# Patient Record
Sex: Female | Born: 1965 | Race: White | Hispanic: No | Marital: Married | Smoking: Never smoker
Health system: Southern US, Community
[De-identification: ages and names within clinical notes are randomized; demographics above are authoritative.]

## PROBLEM LIST (undated history)

## (undated) DIAGNOSIS — M1990 Unspecified osteoarthritis, unspecified site: Secondary | ICD-10-CM

## (undated) DIAGNOSIS — J45909 Unspecified asthma, uncomplicated: Secondary | ICD-10-CM

## (undated) DIAGNOSIS — T7840XA Allergy, unspecified, initial encounter: Secondary | ICD-10-CM

## (undated) HISTORY — DX: Unspecified asthma, uncomplicated: J45.909

## (undated) HISTORY — PX: BREAST SURGERY: SHX581

## (undated) HISTORY — DX: Allergy, unspecified, initial encounter: T78.40XA

## (undated) HISTORY — PX: TONSILECTOMY, ADENOIDECTOMY, BILATERAL MYRINGOTOMY AND TUBES: SHX2538

## (undated) HISTORY — DX: Unspecified osteoarthritis, unspecified site: M19.90

---

## 1997-01-15 HISTORY — PX: BREAST EXCISIONAL BIOPSY: SUR124

## 1997-08-30 ENCOUNTER — Other Ambulatory Visit: Admission: RE | Admit: 1997-08-30 | Discharge: 1997-08-30 | Payer: Self-pay | Admitting: Obstetrics and Gynecology

## 1997-11-10 ENCOUNTER — Other Ambulatory Visit: Admission: RE | Admit: 1997-11-10 | Discharge: 1997-11-10 | Payer: Self-pay | Admitting: *Deleted

## 1997-11-25 ENCOUNTER — Ambulatory Visit (HOSPITAL_COMMUNITY): Admission: RE | Admit: 1997-11-25 | Discharge: 1997-11-25 | Payer: Self-pay | Admitting: *Deleted

## 1998-09-14 ENCOUNTER — Other Ambulatory Visit: Admission: RE | Admit: 1998-09-14 | Discharge: 1998-09-14 | Payer: Self-pay | Admitting: Obstetrics and Gynecology

## 2014-01-18 ENCOUNTER — Ambulatory Visit (INDEPENDENT_AMBULATORY_CARE_PROVIDER_SITE_OTHER): Payer: BLUE CROSS/BLUE SHIELD | Admitting: Family Medicine

## 2014-01-18 VITALS — BP 108/60 | HR 83 | Temp 97.9°F | Resp 16 | Ht 64.0 in | Wt 126.0 lb

## 2014-01-18 DIAGNOSIS — S29019A Strain of muscle and tendon of unspecified wall of thorax, initial encounter: Secondary | ICD-10-CM

## 2014-01-18 DIAGNOSIS — L309 Dermatitis, unspecified: Secondary | ICD-10-CM

## 2014-01-18 DIAGNOSIS — S29009A Unspecified injury of muscle and tendon of unspecified wall of thorax, initial encounter: Secondary | ICD-10-CM

## 2014-01-18 MED ORDER — TRIAMCINOLONE ACETONIDE 0.1 % EX CREA: 1.0000 "application " | TOPICAL_CREAM | Freq: Two times a day (BID) | CUTANEOUS | Status: DC

## 2014-01-18 NOTE — Patient Instructions (Signed)
We will get you set up to see PT and also dermatology- show them the area on your skin that was concerning you Try the triamcinolone (steoid) cream twice a day as needed for your eczema.  Also avoid exposure to cold and prolonged exposure to water.   Use an OTC anti- fungal cream such as lamisil or lotromin for the area on your leg.

## 2014-01-18 NOTE — Progress Notes (Signed)
[facility] [address], [place] [address] [phone]  Date:  01/18/2014   Name:  Erika Mcmahon   DOB:  [birth year]   MRN:  [id number]  PCP:  No PCP Per Patient    Chief Complaint: Rash   History of Present Illness:  Erika Mcmahon is a 49 y.o. very pleasant female patient who presents with the following:  She has a history of eczema since childhood. She has an exacerbation recently mostly on her right arm. She has tried coconut oil and shea butter.  She also notes a circular lesion on her left calf which she thinks may be a ringworm.  She has not yet tried any topical steroids  Also, she notes some pain in her trunk that seemed to occur through weight lifting. She noted onset of pain about 2 months ago.  She would like to continue to train and would like to see PT.  She did not have any acute injury but noted onset of pain in her left ribs after she lifted some heavy weights.  If she lifts more lightly she is ok now "but I have sx if I lift toward 50% of my max.    She has tried some anti-inflammatories for her back.  Declines x-rays for the time being.    There are no active problems to display for this patient.   Past Medical History  Diagnosis Date  . Allergy   . Arthritis   . Asthma     Past Surgical History  Procedure Laterality Date  . Breast surgery    . Tonsilectomy, adenoidectomy, bilateral myringotomy and tubes      History  Substance Use Topics  . Smoking status: Never Smoker   . Smokeless tobacco: Not on file  . Alcohol Use: 0.0 oz/week    0 Not specified per week    History reviewed. No pertinent family history.  No Known Allergies  Medication list has been reviewed and updated.  No current outpatient prescriptions on file prior to visit.   No current facility-administered medications on file prior to visit.    Review of Systems:  As per HPI- otherwise negative. She is generall in good health  Physical  Examination: Filed Vitals:   01/18/14 1238  BP: 108/60  Pulse: 83  Temp: 97.9 F (36.6 C)  Resp: 16   Filed Vitals:   01/18/14 1238  Height:  (1.626 m)  Weight: 126 lb (57.153 kg)   Body mass index is 21.62 kg/(m^2). Ideal Body Weight: Weight in (lb) to have BMI = 25: 145.3  GEN: WDWN, NAD, Non-toxic, A & O x 3, looks well HEENT: Atraumatic, Normocephalic. Neck supple. No masses, No LAD. Ears and Nose: No external deformity. CV: RRR, No M/G/R. No JVD. No thrill. No extra heart sounds. PULM: CTA B, no wheezes, crackles, rhonchi. No retractions. No resp. distress. No accessory muscle use. ABD: S, NT, ND EXTR: No c/c/e NEURO Normal gait.  PSYCH: Normally interactive. Conversant. Not depressed or anxious appearing.  Calm demeanor.  Eczema on her hands and arms, more on the right Well- circumscribed ring worm on the medial right calf Mild tenderness over her left mid ribs, MSK in origin. No abdominal pain   Assessment and Plan: Eczema of both hands - Plan: triamcinolone cream (KENALOG) 0.1 %, Ambulatory referral to Dermatology, CANCELED: Ambulatory referral to Physical Therapy  Thoracic myofascial strain, initial encounter - Plan: Ambulatory referral to Physical Therapy, CANCELED: Ambulatory referral to Dermatology  Referral to derm and to PT as per her request.  Topical steroid cream for eczema, OTC antifungal for ringworm.   She agreed to come back if her trunk pain does not resolve soon  Signed Abbe Amsterdam, MD

## 2014-02-01 ENCOUNTER — Telehealth: Payer: Self-pay | Admitting: *Deleted

## 2014-02-01 NOTE — Telephone Encounter (Signed)
[facility] per Dr Patsy Lageropland.

## 2014-02-22 ENCOUNTER — Other Ambulatory Visit: Payer: Self-pay | Admitting: Obstetrics and Gynecology

## 2014-02-22 DIAGNOSIS — R928 Other abnormal and inconclusive findings on diagnostic imaging of breast: Secondary | ICD-10-CM

## 2014-03-10 ENCOUNTER — Other Ambulatory Visit: Payer: Self-pay

## 2014-03-17 ENCOUNTER — Other Ambulatory Visit: Payer: Self-pay

## 2014-03-24 ENCOUNTER — Ambulatory Visit
Admission: RE | Admit: 2014-03-24 | Discharge: 2014-03-24 | Disposition: A | Discharge status: Home / Self Care | Payer: BLUE CROSS/BLUE SHIELD | Source: Ambulatory Visit | Attending: Obstetrics and Gynecology | Admitting: Obstetrics and Gynecology

## 2014-03-24 ENCOUNTER — Other Ambulatory Visit: Payer: Self-pay | Admitting: Obstetrics and Gynecology

## 2014-03-24 DIAGNOSIS — R928 Other abnormal and inconclusive findings on diagnostic imaging of breast: Secondary | ICD-10-CM

## 2014-10-28 ENCOUNTER — Other Ambulatory Visit: Payer: Self-pay | Admitting: *Deleted

## 2014-10-28 ENCOUNTER — Encounter: Payer: Self-pay | Admitting: Sports Medicine

## 2014-10-28 ENCOUNTER — Ambulatory Visit (INDEPENDENT_AMBULATORY_CARE_PROVIDER_SITE_OTHER): Payer: BLUE CROSS/BLUE SHIELD | Admitting: Sports Medicine

## 2014-10-28 VITALS — BP 85/48 | HR 67 | Ht 64.0 in | Wt 126.0 lb

## 2014-10-28 DIAGNOSIS — M25421 Effusion, right elbow: Secondary | ICD-10-CM

## 2014-10-28 DIAGNOSIS — IMO0002 Reserved for concepts with insufficient information to code with codable children: Secondary | ICD-10-CM

## 2014-10-28 DIAGNOSIS — M25521 Pain in right elbow: Secondary | ICD-10-CM

## 2014-10-28 DIAGNOSIS — M779 Enthesopathy, unspecified: Secondary | ICD-10-CM | POA: Diagnosis not present

## 2014-10-28 MED ORDER — NITROGLYCERIN 0.2 MG/HR TD PT24: MEDICATED_PATCH | TRANSDERMAL | Status: DC

## 2014-10-28 NOTE — Assessment & Plan Note (Signed)
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  She does have evidence of disruption of the RCL on the lateral aspect of the deep fibers of the common extensor tendon.  Small calcific deposition in this region as well.  US does not show cartilage deficits of the capitellum or trochlea - Will attempt conservative measures with nitro patch, eccentric exercises, compression sleeve - With the RCL and small OCD lesion however, surgical management long term is an option if she does not respond over 3-6 months.

## 2014-10-28 NOTE — Progress Notes (Signed)
  Erika Mcmahon Slovacek - 49 y.o. female MRN [id number]  Date of birth: October 11, 1965 Erika Mcmahon Newman is a 49 y.o. female who presents today for R elbow pain   Right elbow pain, initial visit-patient presents today with ongoing right elbow pain for several months now. 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 However she also performs a lot of weight lifting activity including Olympic lifts and CrossFit. She denies a specific injury which brought this on and has noticed pain in the medial, lateral, posterior aspect of her elbow. There is no paresthesias or weakness going down her arm. No pain in her forearm at this time. Rest does improve this. Pain is worse with any type pushup activity or febrile elbow flexion with Olympic lifting. She denies any dislocation or popping/locking of the joint. Denies frank swelling.  She has tried massage therapy which has helped along with dry needling. She also has tried some topical medication which is helped with the pain as well.  PMHx - Updated and reviewed.  Contributory factors include: Noncontributory  PSHx - Updated and reviewed.  Contributory factors include:  Noncontributory  FHx - Updated and reviewed.  Contributory factors include:  Non contributory  Medications - None    ROS Per HPI.  12 point negative other than per HPI.   Exam:  Filed Vitals:   10/28/14 0836  BP: 85/48  Pulse: 67   Gen: NAD, AAO x 3.  Normal affect and mood.  Cardiorespiratory - Normal respiratory effort/rate.  RRR Skin: No rash, dry intact Extremities: No edema, vascularly intact 2+  R elbow: Slightly increased carrying angle R > L.  Otherwise normal inspection.  No erythema or skin changes.  Minimal TTP medial/lateral epicondyle.  Full ROM.  MS 5/5 in all directions. Negative third finger extension test.  Negative eccentric loading of flexion/extension.  Negative valgus stress testing. Neurovascular intact  Imaging:  Ultrasound of right elbow reveals normal appearing hyaline  cartilage of the capitellum and radial head along with the trochlea and coronoid fossa. She does have evidence of hypoechoic ill-defined heterogenic areas of her medial and lateral common tendons. She has a 30% hypoechoic tear of the deep portion of her extensor common tendon/radial collateral ligament with a small calcific piece in the radiocapitellar joint.

## 2014-11-11 ENCOUNTER — Other Ambulatory Visit: Payer: Self-pay | Admitting: Obstetrics and Gynecology

## 2014-11-11 DIAGNOSIS — N6489 Other specified disorders of breast: Secondary | ICD-10-CM

## 2014-11-19 ENCOUNTER — Ambulatory Visit
Admission: RE | Admit: 2014-11-19 | Discharge: 2014-11-19 | Disposition: A | Discharge status: Home / Self Care | Payer: BLUE CROSS/BLUE SHIELD | Source: Ambulatory Visit | Attending: Obstetrics and Gynecology | Admitting: Obstetrics and Gynecology

## 2014-11-19 DIAGNOSIS — N6489 Other specified disorders of breast: Secondary | ICD-10-CM

## 2014-12-08 ENCOUNTER — Ambulatory Visit (INDEPENDENT_AMBULATORY_CARE_PROVIDER_SITE_OTHER): Payer: BLUE CROSS/BLUE SHIELD | Admitting: Sports Medicine

## 2014-12-08 ENCOUNTER — Encounter: Payer: Self-pay | Admitting: Sports Medicine

## 2014-12-08 VITALS — BP 98/60 | Ht 64.0 in | Wt 126.0 lb

## 2014-12-08 DIAGNOSIS — IMO0002 Reserved for concepts with insufficient information to code with codable children: Secondary | ICD-10-CM

## 2014-12-08 DIAGNOSIS — M779 Enthesopathy, unspecified: Secondary | ICD-10-CM | POA: Diagnosis not present

## 2014-12-08 NOTE — Patient Instructions (Addendum)
Beet root juice = used for oral nitric oxide supplement  Tart cherry juice for antioxidant activity - particularly good after hard workouts 2 to 4 hours after  Remember key movements for elbow Pronation and supination Wrist flexion and extension Hand grip  Maintain these activities long term  I suspect in 6 weeks you won't need us that would be a normal healing time for this injury  NTG still helps but don't push the skin irritation  Compression primarily valuable for hard lifts    

## 2014-12-08 NOTE — Assessment & Plan Note (Signed)
plan under office visit as she has made significant improvement

## 2014-12-08 NOTE — Progress Notes (Signed)
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 Last visit she appeared to have some damage to both the medial and the lateral epicondyle on the right  Since that time she has used nitroglycerin She has used an elbow compression sleeve She is doing home exercises She has continued working with the therapist and doing some dry needling  She feels that she is about 80% better  Review of systems No other joint swelling No numbness or tingling in the hand No neck pain or symptoms  Physical exam Muscular female in no acute distress BP 98/60 mmHg  Ht 5\' 4"  (1.626 m)  Wt 126 lb (57.153 kg)  BMI 21.62 kg/m2  Full range of motion of the right elbow No tenderness to palpation of lateral epicondyle No tenderness to palpation medial epicondyle Excellent strength on flexion and extension Book test on the right does cause mild pain with extension Book test on right causes no pain with flexion Handgrip is strong and is not painful Pain with pronation and supinatin is mild  Assessment; adequate healing of medial and lateral epicondylitis related to weight lifting   Plan Try some supplements for muscle recovery  Beet root juice = used for oral nitric oxide supplement  Tart cherry juice for antioxidant activity - particularly good after hard workouts 2 to 4 hours after  Remember key movements for elbow Pronation and supination Wrist flexion and extension Hand grip  Maintain these activities long term  I suspect in 6 weeks you won't need us that would be a normal healing time for this injury  NTG still helps but don't push the skin irritation  Compression primarily valuable for hard lifts

## 2014-12-08 NOTE — Progress Notes (Signed)
Beet root juice = used for oral nitric oxide supplement  Tart cherry juice for antioxidant activity - particularly good after hard workouts 2 to 4 hours after  Remember key movements for elbow Pronation and supination Wrist flexion and extension Hand grip  Maintain these activities long term  I suspect in 6 weeks you won't need us that would be a normal healing time for this injury  NTG still helps but don't push the skin irritation  Compression primarily valuable for hard lifts

## 2015-01-11 ENCOUNTER — Ambulatory Visit (INDEPENDENT_AMBULATORY_CARE_PROVIDER_SITE_OTHER): Payer: BLUE CROSS/BLUE SHIELD | Admitting: Family Medicine

## 2015-01-11 VITALS — BP 110/60 | HR 75 | Temp 98.0°F | Resp 20 | Ht 64.0 in | Wt 130.2 lb

## 2015-01-11 DIAGNOSIS — J209 Acute bronchitis, unspecified: Secondary | ICD-10-CM

## 2015-01-11 MED ORDER — ALBUTEROL SULFATE 108 (90 BASE) MCG/ACT IN AEPB: 2.0000 | INHALATION_SPRAY | RESPIRATORY_TRACT | Status: DC | PRN

## 2015-01-11 MED ORDER — HYDROCOD POLST-CPM POLST ER 10-8 MG/5ML PO SUER: 5.0000 mL | Freq: Every evening | ORAL | Status: DC | PRN

## 2015-01-11 MED ORDER — MUCINEX DM MAXIMUM STRENGTH 60-1200 MG PO TB12: 1.0000 | ORAL_TABLET | Freq: Two times a day (BID) | ORAL | Status: DC

## 2015-01-11 NOTE — Progress Notes (Signed)
Subjective:  By signing my name below, I, Erika Mcmahon, attest that this documentation has been prepared under the direction and in the presence of Erika SorensonEva Sonji Starkes, MD.  Erika Mcmahon, Medical Scribe. 01/11/2015.  4:35 PM.   Patient ID: Erika Mcmahon, female    DOB: April 16, 1965, 49 y.o.   MRN: [id number]  Chief Complaint  Patient presents with  . Cough    x 2 week   . Headache    HPI HPI Comments: Erika Mcmahon is a 49 y.o. female who presents to [facility] complaining of productive colorful cough, gradual onset 2 weeks ago.  She reports that the symptoms started with post nasal drip. She also notes associated symptoms of wheezing, sore throat, shortness of breath, chest tightness, and headache. She states that she took zytrec that relieved her sore throat, vitamine C, and natural home medication. She reports sick contact with her wife at home who present with similar sx. She has a hx bronchitis, and asthma, however she does not currently use an inhaler. She reports finding good relief with inhalers previously. Pt is not a smoker. She denies chills, fever, or chest pain.    Patient Active Problem List   Diagnosis Date Noted  . Epicondylitis 10/28/2014   Past Medical History  Diagnosis Date  . Allergy   . Arthritis   . Asthma    Past Surgical History  Procedure Laterality Date  . Breast surgery    . Tonsilectomy, adenoidectomy, bilateral myringotomy and tubes     No Known Allergies Prior to Admission medications   Medication Sig Start Date End Date Taking? Authorizing Provider  nitroGLYCERIN (NITRODUR - DOSED IN MG/24 HR) 0.2 mg/hr patch Place 1/4 patch to affected area daily 10/28/14  Yes Enid BaasKarl Fields, MD  triamcinolone cream (KENALOG) 0.1 % Apply 1 application topically 2 (two) times daily. Use as needed for eczema 01/18/14  Yes Pearline CablesJessica C Copland, MD   Social History   Social History  . Marital Status: Married    Spouse Name: N/A  . Number of Children: N/A    . Years of Education: N/A   Occupational History  . Not on file.   Social History Main Topics  . Smoking status: Never Smoker   . Smokeless tobacco: Not on file  . Alcohol Use: 0.0 oz/week    0 Standard drinks or equivalent per week  . Drug Use: No  . Sexual Activity: Not on file   Other Topics Concern  . Not on file   Social History Narrative    Review of Systems  Constitutional: Negative for fever and chills.  HENT: Positive for postnasal drip and sore throat.   Respiratory: Positive for cough, chest tightness, shortness of breath and wheezing.   Cardiovascular: Negative for chest pain.  Neurological: Positive for headaches.       Objective:   Physical Exam  Constitutional: She is oriented to person, place, and time. She appears well-developed and well-nourished. No distress.  HENT:  Head: Normocephalic and atraumatic.  Right Ear: Tympanic membrane and external ear normal.  Left Ear: Tympanic membrane and external ear normal.  Mouth/Throat: Oropharynx is clear and moist. No oropharyngeal exudate.  Lots of post nasal drip.  Normal thyroid.  A little anterior and posterior adenopathy palpable.   Eyes: EOM are normal. Pupils are equal, round, and reactive to light.  Neck: Neck supple.  Cardiovascular: Normal rate, regular rhythm and normal heart sounds.   No murmur heard. Pulmonary/Chest: Effort  normal and breath sounds normal. No respiratory distress. She has no wheezes.  Good air movement.   Neurological: She is alert and oriented to person, place, and time. No cranial nerve deficit.  Skin: Skin is warm and dry.  Psychiatric: She has a normal mood and affect. Her behavior is normal.  Nursing note and vitals reviewed.   BP 110/60 mmHg  Pulse 75  Temp(Src) 98 F (36.7 C) (Oral)  Resp 20  Ht  (1.626 m)  Wt 130 lb 3.2 oz (59.058 kg)  BMI 22.34 kg/m2  SpO2 98%  LMP 06/15/2013     Assessment & Plan:  If symptoms worsen, I will call in Z-pac. 1. Acute  bronchitis, unspecified organism     Meds ordered this encounter  Medications  . Albuterol Sulfate (PROAIR RESPICLICK) 108 (90 Base) MCG/ACT AEPB    Sig: Inhale 2 puffs into the lungs every 4 (four) hours as needed.    Dispense:  1 each    Refill:  2  . chlorpheniramine-HYDROcodone (TUSSIONEX PENNKINETIC ER) 10-8 MG/5ML SUER    Sig: Take 5 mLs by mouth at bedtime as needed for cough.    Dispense:  180 mL    Refill:  0  . Dextromethorphan-Guaifenesin (MUCINEX DM MAXIMUM STRENGTH) 60-1200 MG TB12    Sig: Take 1 tablet by mouth every 12 (twelve) hours.    Dispense:  30 each    Refill:  1    I personally performed the services described in this documentation, which was scribed in my presence. The recorded information has been reviewed and considered, and addended by me as needed.  Erika Sorenson, MD MPH

## 2015-01-11 NOTE — Patient Instructions (Signed)

## 2015-04-04 ENCOUNTER — Other Ambulatory Visit: Payer: Self-pay | Admitting: Obstetrics and Gynecology

## 2015-04-04 DIAGNOSIS — N6489 Other specified disorders of breast: Secondary | ICD-10-CM

## 2015-04-12 ENCOUNTER — Ambulatory Visit
Admission: RE | Admit: 2015-04-12 | Discharge: 2015-04-12 | Disposition: A | Discharge status: Home / Self Care | Payer: BLUE CROSS/BLUE SHIELD | Source: Ambulatory Visit | Attending: Obstetrics and Gynecology | Admitting: Obstetrics and Gynecology

## 2015-04-12 DIAGNOSIS — N6489 Other specified disorders of breast: Secondary | ICD-10-CM

## 2015-04-14 ENCOUNTER — Other Ambulatory Visit: Payer: Self-pay | Admitting: Sports Medicine

## 2015-08-24 ENCOUNTER — Other Ambulatory Visit: Payer: Self-pay | Admitting: Gastroenterology

## 2015-08-24 DIAGNOSIS — R932 Abnormal findings on diagnostic imaging of liver and biliary tract: Secondary | ICD-10-CM

## 2015-09-08 ENCOUNTER — Ambulatory Visit
Admission: RE | Admit: 2015-09-08 | Discharge: 2015-09-08 | Disposition: A | Discharge status: Home / Self Care | Payer: BLUE CROSS/BLUE SHIELD | Source: Ambulatory Visit | Attending: Gastroenterology | Admitting: Gastroenterology

## 2015-09-08 DIAGNOSIS — R932 Abnormal findings on diagnostic imaging of liver and biliary tract: Secondary | ICD-10-CM

## 2015-09-16 ENCOUNTER — Inpatient Hospital Stay
Admission: RE | Admit: 2015-09-16 | Discharge: 2015-09-16 | Disposition: A | Discharge status: Home / Self Care | Payer: Self-pay | Source: Ambulatory Visit | Attending: Gastroenterology | Admitting: Gastroenterology

## 2015-09-16 ENCOUNTER — Other Ambulatory Visit: Payer: Self-pay | Admitting: Gastroenterology

## 2015-09-16 DIAGNOSIS — R1011 Right upper quadrant pain: Secondary | ICD-10-CM

## 2016-01-28 ENCOUNTER — Encounter (HOSPITAL_COMMUNITY): Payer: Self-pay | Admitting: Emergency Medicine

## 2016-01-28 ENCOUNTER — Ambulatory Visit (INDEPENDENT_AMBULATORY_CARE_PROVIDER_SITE_OTHER): Payer: BLUE CROSS/BLUE SHIELD

## 2016-01-28 ENCOUNTER — Ambulatory Visit (HOSPITAL_COMMUNITY)
Admission: EM | Admit: 2016-01-28 | Discharge: 2016-01-28 | Disposition: A | Discharge status: Home / Self Care | Payer: BLUE CROSS/BLUE SHIELD | Attending: Internal Medicine | Admitting: Internal Medicine

## 2016-01-28 DIAGNOSIS — S52125A Nondisplaced fracture of head of left radius, initial encounter for closed fracture: Secondary | ICD-10-CM

## 2016-01-28 NOTE — ED Triage Notes (Signed)
[facility] a complaint of left elbow pain secondary to fall that occurred today.

## 2016-01-28 NOTE — ED Provider Notes (Signed)
CSN: [id number]     Arrival date & time 01/28/16  1753 History   First MD Initiated Contact with Patient 01/28/16 2007     Chief Complaint  Patient presents with  . Fall   (Consider location/radiation/quality/duration/timing/severity/associated sxs/prior Treatment) Patient is a well-appearing 51 year old female, was doing slacklining when she fell off the slack line, landed on her left side body, now is having pain at the left elbow and wrisk. Pain is 0 out of 10 and at rest. Pain is worst with movement. Denies numbness or tingling sensation.       Past Medical History:  Diagnosis Date  . Allergy   . Arthritis   . Asthma    Past Surgical History:  Procedure Laterality Date  . BREAST SURGERY    . TONSILECTOMY, ADENOIDECTOMY, BILATERAL MYRINGOTOMY AND TUBES     History reviewed. No pertinent family history. Social History  Substance Use Topics  . Smoking status: Never Smoker  . Smokeless tobacco: Not on file  . Alcohol use 0.0 oz/week   OB History    No data available     Review of Systems  All other systems reviewed and are negative.   Allergies  Patient has no known allergies.  Home Medications   Prior to Admission medications   Not on File   Meds Ordered and Administered this Visit  Medications - No data to display  BP 107/56 (BP Location: Right Arm)   Pulse 68   Temp 98 F (36.7 C) (Oral)   Resp 16   LMP 06/15/2013   SpO2 100%  No data found.   Physical Exam  Constitutional: She appears well-developed and well-nourished.  Cardiovascular: Normal rate, regular rhythm and normal heart sounds.   Pulmonary/Chest: Effort normal and breath sounds normal. No respiratory distress. She has no wheezes.  Musculoskeletal:  Left wrist: No swelling, no deformity, has full range of motion.   Left Elbow: No swelling, no deformity, pain reproducible with movement. Has slight limited ROM   Nursing note and vitals reviewed.   Urgent Care Course   Clinical  Course     Procedures (including critical care time)  Labs Review Labs Reviewed - No data to display  Imaging Review Dg Elbow Complete Left  Result Date: 01/28/2016 CLINICAL DATA:  Patient states that she fell today injuring her left wrist and elbow, patient used the left side to catch her fall. Hx of break to the left arm as a child. EXAM: LEFT ELBOW - COMPLETE 3+ VIEW COMPARISON:  None. FINDINGS: There is a subtle fracture of the radial neck, nondisplaced and non comminuted. No other evidence of a fracture. The elbow joint is normally spaced and aligned. Probable small joint effusion.  Soft tissues are unremarkable. IMPRESSION: Subtle nondisplaced fracture of the radial neck Electronically Signed   By: Amie Portlandavid  Ormond M.D.   On: 01/28/2016 20:01   Dg Wrist Complete Left  Result Date: 01/28/2016 CLINICAL DATA:  Patient states that she fell today injuring her left wrist and elbow, patient used the left side to catch her fall. Hx of break to the left arm as a child. EXAM: LEFT WRIST - COMPLETE 3+ VIEW COMPARISON:  None. FINDINGS: There is no evidence of fracture or dislocation. There is no evidence of arthropathy or other focal bone abnormality. Soft tissues are unremarkable. IMPRESSION: Negative. Electronically Signed   By: Amie Portlandavid  Ormond M.D.   On: 01/28/2016 19:59   MDM   1. Closed nondisplaced fracture of head of left radius, initial  encounter    Wrist xray unremarkable. Elbow xray shows a subtle nondisplaced fracture of the radial neck. Sugar tong splint applied along with a sling. Referred to [facility] orthopaedic for further management. Informed to call Monday to schedule an appointment as soon as possible. Splint care discussed. Take ibuprofen for pain relief.   Lucia Estelle, NP 01/28/16 2053

## 2016-01-28 NOTE — Progress Notes (Signed)
Orthopedic Tech Progress Note Patient Details:  Erika RedbirdLisa Cerino 12-31-1965 [id number]  Ortho Devices Type of Ortho Device: Sugartong splint, Arm sling Ortho Device/Splint Location: lue Ortho Device/Splint Interventions: Ordered, Application After speaking with dr decided pt only needed a regular sugartong.  Trinna PostMartinez, [name] [name] 01/28/2016, 8:57 PM

## 2016-01-30 ENCOUNTER — Encounter: Payer: Self-pay | Admitting: Family Medicine

## 2016-01-30 ENCOUNTER — Ambulatory Visit (INDEPENDENT_AMBULATORY_CARE_PROVIDER_SITE_OTHER): Payer: BLUE CROSS/BLUE SHIELD | Admitting: Family Medicine

## 2016-01-30 DIAGNOSIS — S59902A Unspecified injury of left elbow, initial encounter: Secondary | ICD-10-CM

## 2016-01-30 NOTE — Patient Instructions (Addendum)
You have a radial neck fracture. Wear the splint at all times. Follow up with me in just over a week. We will remove the splint, repeat x-rays, plan to start motion exercises and advance to strengthening. These typically take 4-6 weeks to resolve though regaining full motion may take longer. Ibuprofen 600mg  3-4 times a day with food OR aleve 2 tabs twice a day with food for pain and inflammation. To return to rock climbing, you have to have full motion, strength 80% of the opposite side, no pain, and at least 4 weeks (for fracture healing). Elevate above your heart as much as possible.

## 2016-01-31 DIAGNOSIS — S59902D Unspecified injury of left elbow, subsequent encounter: Secondary | ICD-10-CM | POA: Insufficient documentation

## 2016-01-31 NOTE — Progress Notes (Signed)
PCP: Dr. Zelphia CairoGretchen Adkins  Subjective:   HPI: Patient is a 51 y.o. female here for left elbow injury.  Patient reports on 1/13 she was slacklining when she fell off and landed onto her left side. Fell mainly onto left wrist but felt pain, pop left elbow. She has been taking ibuprofen 800mg  daily. Some numbness into thumb. Pain level 1/10, dull posterior elbow. Wearing a sugar tong splint with sling from ED. No skin changes though some swelling.  Past Medical History:  Diagnosis Date  . Allergy   . Arthritis   . Asthma     No current outpatient prescriptions on file prior to visit.   No current facility-administered medications on file prior to visit.     Past Surgical History:  Procedure Laterality Date  . BREAST SURGERY    . TONSILECTOMY, ADENOIDECTOMY, BILATERAL MYRINGOTOMY AND TUBES      No Known Allergies  Social History   Social History  . Marital status: Married    Spouse name: N/A  . Number of children: N/A  . Years of education: N/A   Occupational History  . Not on file.   Social History Main Topics  . Smoking status: Never Smoker  . Smokeless tobacco: Never Used  . Alcohol use 0.0 oz/week  . Drug use: No  . Sexual activity: Not on file   Other Topics Concern  . Not on file   Social History Narrative  . No narrative on file    No family history on file.  BP (!) 97/58   Pulse 72   Ht 5\' 4"  (1.626 m)   Wt 125 lb (56.7 kg)   LMP 06/15/2013   BMI 21.46 kg/m   Review of Systems: See HPI above.     Objective:  Physical Exam:  Gen: NAD, comfortable in exam room  Left elbow: Mild swelling circumferentially.  No bruising, other deformity. Minimal TTP radial head.  No other tenderness about elbow or upper arm. Lacks about 15 degrees extension.  Full flexion.   Collateral ligaments intact. NVI distally.  Right elbow: FROM without pain.   Assessment & Plan:  1. Left elbow injury - independently reviewed radiographs - effusion noted  and radial neck fracture that is nondisplaced.  Sugar tong splint felt uncomfortable - switched to posterior splint today with sling.  Elevation.  Ibuprofen or aleve if needed.  F/u in just over a week - hope to remove splint and sling, repeat x-rays, start motion exercises.

## 2016-01-31 NOTE — Assessment & Plan Note (Signed)
independently reviewed radiographs - effusion noted and radial neck fracture that is nondisplaced.  Sugar tong splint felt uncomfortable - switched to posterior splint today with sling.  Elevation.  Ibuprofen or aleve if needed.  F/u in just over a week - hope to remove splint and sling, repeat x-rays, start motion exercises.

## 2016-02-08 ENCOUNTER — Ambulatory Visit (HOSPITAL_BASED_OUTPATIENT_CLINIC_OR_DEPARTMENT_OTHER)
Admission: RE | Admit: 2016-02-08 | Discharge: 2016-02-08 | Disposition: A | Discharge status: Home / Self Care | Payer: BLUE CROSS/BLUE SHIELD | Source: Ambulatory Visit | Attending: Family Medicine | Admitting: Family Medicine

## 2016-02-08 ENCOUNTER — Ambulatory Visit (INDEPENDENT_AMBULATORY_CARE_PROVIDER_SITE_OTHER): Payer: BLUE CROSS/BLUE SHIELD | Admitting: Family Medicine

## 2016-02-08 ENCOUNTER — Encounter: Payer: Self-pay | Admitting: Family Medicine

## 2016-02-08 VITALS — BP 110/71 | HR 90 | Ht 64.0 in | Wt 125.0 lb

## 2016-02-08 DIAGNOSIS — S59902D Unspecified injury of left elbow, subsequent encounter: Secondary | ICD-10-CM | POA: Diagnosis not present

## 2016-02-08 DIAGNOSIS — S59902A Unspecified injury of left elbow, initial encounter: Secondary | ICD-10-CM | POA: Diagnosis not present

## 2016-02-08 DIAGNOSIS — S52132A Displaced fracture of neck of left radius, initial encounter for closed fracture: Secondary | ICD-10-CM | POA: Diagnosis not present

## 2016-02-08 DIAGNOSIS — X58XXXA Exposure to other specified factors, initial encounter: Secondary | ICD-10-CM | POA: Diagnosis not present

## 2016-02-08 NOTE — Patient Instructions (Signed)
You have a radial neck fracture. Try to go without the splint now. Focus on motion exercises to regain flexion and extension (passive table hangs, using other arm to flex and extend).  Don't force beyond pain. No strengthening until you've regained your motion. Follow up with me in 4 weeks for reevaluation. Ibuprofen 600mg  3-4 times a day with food OR aleve 2 tabs twice a day with food for pain and inflammation as needed. To return to rock climbing, you have to have full motion, strength 80% of the opposite side, no pain, and at least 4 weeks (for fracture healing).

## 2016-02-09 NOTE — Assessment & Plan Note (Signed)
independently reviewed repeat radiographs - still with nondisplaced radial neck fracture and no interval healing.  Discontinue splint.  Elevation, ibuprofen or aleve.  Shown home motion exercises to start now to regain flexion and extension.  Should not need to repeat radiographs unless not clinically healing as expected.  F/u in 4 weeks.

## 2016-02-09 NOTE — Progress Notes (Signed)
PCP: Dr. Zelphia CairoGretchen Adkins  Subjective:   HPI: Patient is a 51 y.o. female here for left elbow injury.  1/15: Patient reports on 1/13 she was slacklining when she fell off and landed onto her left side. Fell mainly onto left wrist but felt pain, pop left elbow. She has been taking ibuprofen 800mg  daily. Some numbness into thumb. Pain level 1/10, dull posterior elbow. Wearing a sugar tong splint with sling from ED. No skin changes though some swelling.  1/24: Patient reports she is doing much better. Some pain at night. Tolerating the splint. Currently in the splint pain is 0/10 level. Some bruising. Swelling has improved. Not taking anything for pain. No skin changes, numbness otherwise.  Past Medical History:  Diagnosis Date  . Allergy   . Arthritis   . Asthma     No current outpatient prescriptions on file prior to visit.   No current facility-administered medications on file prior to visit.     Past Surgical History:  Procedure Laterality Date  . BREAST SURGERY    . TONSILECTOMY, ADENOIDECTOMY, BILATERAL MYRINGOTOMY AND TUBES      No Known Allergies  Social History   Social History  . Marital status: Married    Spouse name: N/A  . Number of children: N/A  . Years of education: N/A   Occupational History  . Not on file.   Social History Main Topics  . Smoking status: Never Smoker  . Smokeless tobacco: Never Used  . Alcohol use 0.0 oz/week  . Drug use: No  . Sexual activity: Not on file   Other Topics Concern  . Not on file   Social History Narrative  . No narrative on file    No family history on file.  BP 110/71   Pulse 90   Ht 5\' 4"  (1.626 m)   Wt 125 lb (56.7 kg)   LMP 06/15/2013   BMI 21.46 kg/m   Review of Systems: See HPI above.     Objective:  Physical Exam:  Gen: NAD, comfortable in exam room  Left elbow: Splint removed. Mild swelling circumferentially with some posterior bruising.  No other deformity. No TTP radial  head.  No other tenderness about elbow or upper arm. Lacks 40 degrees extension and 20 degrees flexion.   Collateral ligaments intact. NVI distally.  Right elbow: FROM without pain.   Assessment & Plan:  1. Left elbow injury - independently reviewed repeat radiographs - still with nondisplaced radial neck fracture and no interval healing.  Discontinue splint.  Elevation, ibuprofen or aleve.  Shown home motion exercises to start now to regain flexion and extension.  Should not need to repeat radiographs unless not clinically healing as expected.  F/u in 4 weeks.

## 2016-02-20 ENCOUNTER — Encounter: Payer: Self-pay | Admitting: Family Medicine

## 2016-02-29 ENCOUNTER — Other Ambulatory Visit: Payer: Self-pay | Admitting: Obstetrics and Gynecology

## 2016-02-29 ENCOUNTER — Encounter: Payer: Self-pay | Admitting: Family Medicine

## 2016-02-29 DIAGNOSIS — N6489 Other specified disorders of breast: Secondary | ICD-10-CM

## 2016-03-07 ENCOUNTER — Other Ambulatory Visit: Payer: Self-pay | Admitting: Gastroenterology

## 2016-03-07 DIAGNOSIS — R198 Other specified symptoms and signs involving the digestive system and abdomen: Secondary | ICD-10-CM

## 2016-03-08 ENCOUNTER — Ambulatory Visit (INDEPENDENT_AMBULATORY_CARE_PROVIDER_SITE_OTHER): Payer: BLUE CROSS/BLUE SHIELD | Admitting: Family Medicine

## 2016-03-08 ENCOUNTER — Encounter: Payer: Self-pay | Admitting: Family Medicine

## 2016-03-08 DIAGNOSIS — S59902D Unspecified injury of left elbow, subsequent encounter: Secondary | ICD-10-CM

## 2016-03-12 NOTE — Progress Notes (Signed)
PCP: Dr. Zelphia CairoGretchen Adkins  Subjective:   HPI: Patient is a 51 y.o. female here for left elbow injury.  1/15: Patient reports on 1/13 she was slacklining when she fell off and landed onto her left side. Fell mainly onto left wrist but felt pain, pop left elbow. She has been taking ibuprofen 800mg  daily. Some numbness into thumb. Pain level 1/10, dull posterior elbow. Wearing a sugar tong splint with sling from ED. No skin changes though some swelling.  1/24: Patient reports she is doing much better. Some pain at night. Tolerating the splint. Currently in the splint pain is 0/10 level. Some bruising. Swelling has improved. Not taking anything for pain. No skin changes, numbness otherwise.  2/22: Patient reports she's doing very well. No pain or swelling. Feels an occasional catch with a lot of exercise deep in elbow.  Does not limit her though. Motion has returned. No other complaints. No skin changes, numbness.  Past Medical History:  Diagnosis Date  . Allergy   . Arthritis   . Asthma     No current outpatient prescriptions on file prior to visit.   No current facility-administered medications on file prior to visit.     Past Surgical History:  Procedure Laterality Date  . BREAST SURGERY    . TONSILECTOMY, ADENOIDECTOMY, BILATERAL MYRINGOTOMY AND TUBES      No Known Allergies  Social History   Social History  . Marital status: Married    Spouse name: N/A  . Number of children: N/A  . Years of education: N/A   Occupational History  . Not on file.   Social History Main Topics  . Smoking status: Never Smoker  . Smokeless tobacco: Never Used  . Alcohol use 0.0 oz/week  . Drug use: No  . Sexual activity: Not on file   Other Topics Concern  . Not on file   Social History Narrative  . No narrative on file    No family history on file.  BP 106/65   Pulse 65   Ht 5\' 4"  (1.626 m)   Wt 125 lb (56.7 kg)   LMP 06/15/2013   BMI 21.46 kg/m    Review of Systems: See HPI above.     Objective:  Physical Exam:  Gen: NAD, comfortable in exam room  Left elbow: No swelling, bruising,  other deformity. No TTP radial head.  No other tenderness about elbow or upper arm. FROM now 5/5 strength with flexion and extension. Collateral ligaments intact. NVI distally.  Right elbow: FROM without pain.   Assessment & Plan:  1. Left elbow injury - nondisplaced radial neck fracture.  Clinically healed at this point and has done well to regain motion and strength.  Occasional catch is becoming more infrequent and exam is reassuring.  Advised to let us know if this doesn't continue to resolve.  No restrictions.  Tylenol, icing only if needed.  F/u prn.

## 2016-03-12 NOTE — Assessment & Plan Note (Signed)
nondisplaced radial neck fracture.  Clinically healed at this point and has done well to regain motion and strength.  Occasional catch is becoming more infrequent and exam is reassuring.  Advised to let us know if this doesn't continue to resolve.  No restrictions.  Tylenol, icing only if needed.  F/u prn.

## 2016-03-14 ENCOUNTER — Ambulatory Visit
Admission: RE | Admit: 2016-03-14 | Discharge: 2016-03-14 | Disposition: A | Discharge status: Home / Self Care | Payer: BLUE CROSS/BLUE SHIELD | Source: Ambulatory Visit | Attending: Gastroenterology | Admitting: Gastroenterology

## 2016-03-14 DIAGNOSIS — R198 Other specified symptoms and signs involving the digestive system and abdomen: Secondary | ICD-10-CM

## 2016-04-12 ENCOUNTER — Ambulatory Visit
Admission: RE | Admit: 2016-04-12 | Discharge: 2016-04-12 | Disposition: A | Discharge status: Home / Self Care | Payer: BLUE CROSS/BLUE SHIELD | Source: Ambulatory Visit | Attending: Obstetrics and Gynecology | Admitting: Obstetrics and Gynecology

## 2016-04-12 DIAGNOSIS — N6489 Other specified disorders of breast: Secondary | ICD-10-CM

## 2018-08-11 IMAGING — DX DG ELBOW COMPLETE 3+V*L*
4 series · 4 of 4 positions shown · non-contrast
Comparison: 01/28/2016

CLINICAL DATA: Fracture, follow-up

EXAM:
LEFT ELBOW - COMPLETE 3+ VIEW

[elbow ap]
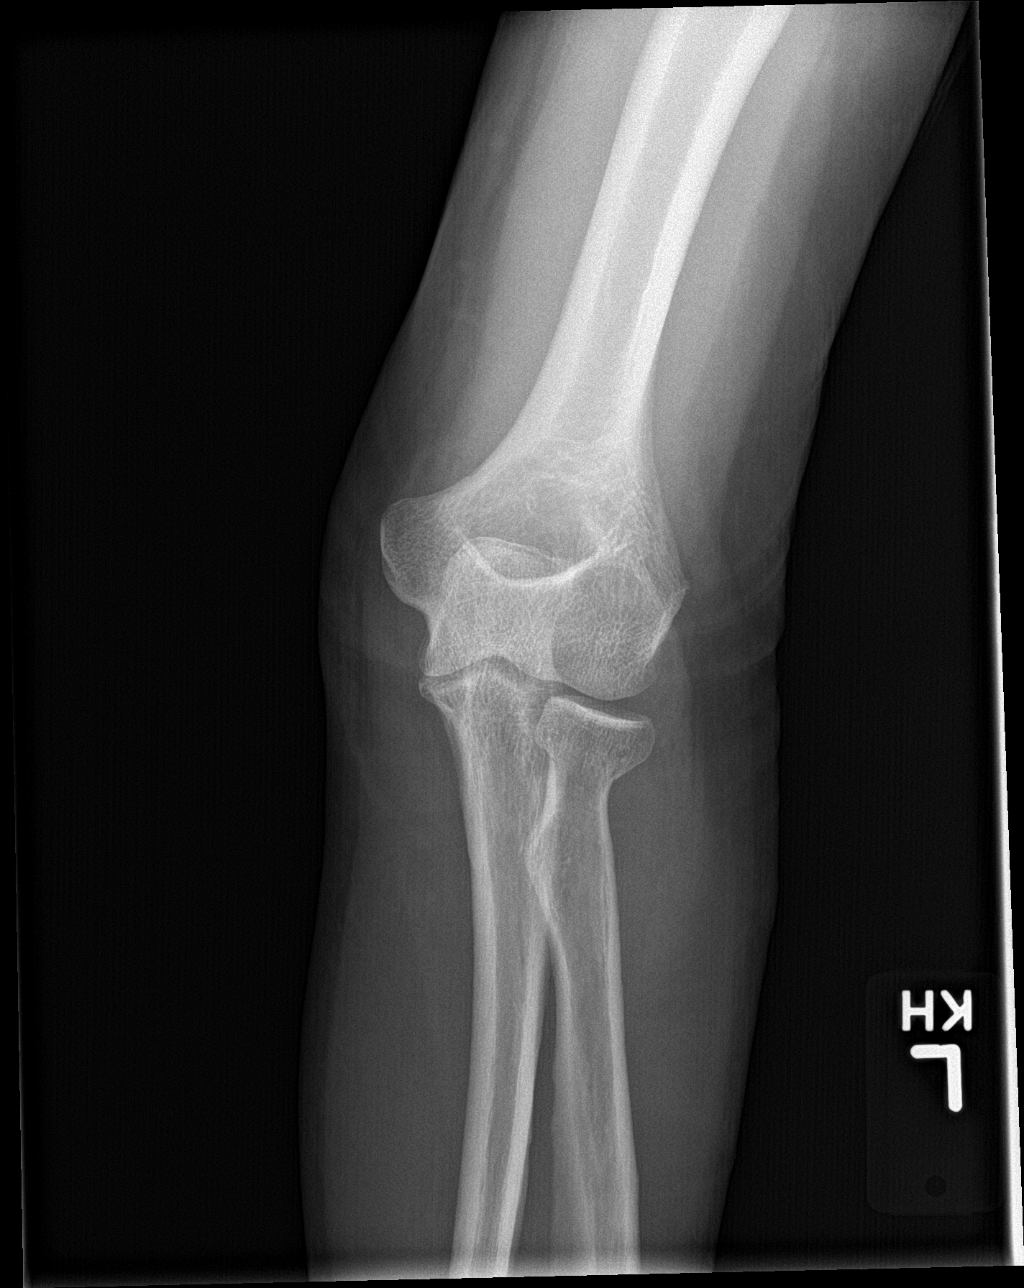

[elbow obl (1 of 2)]
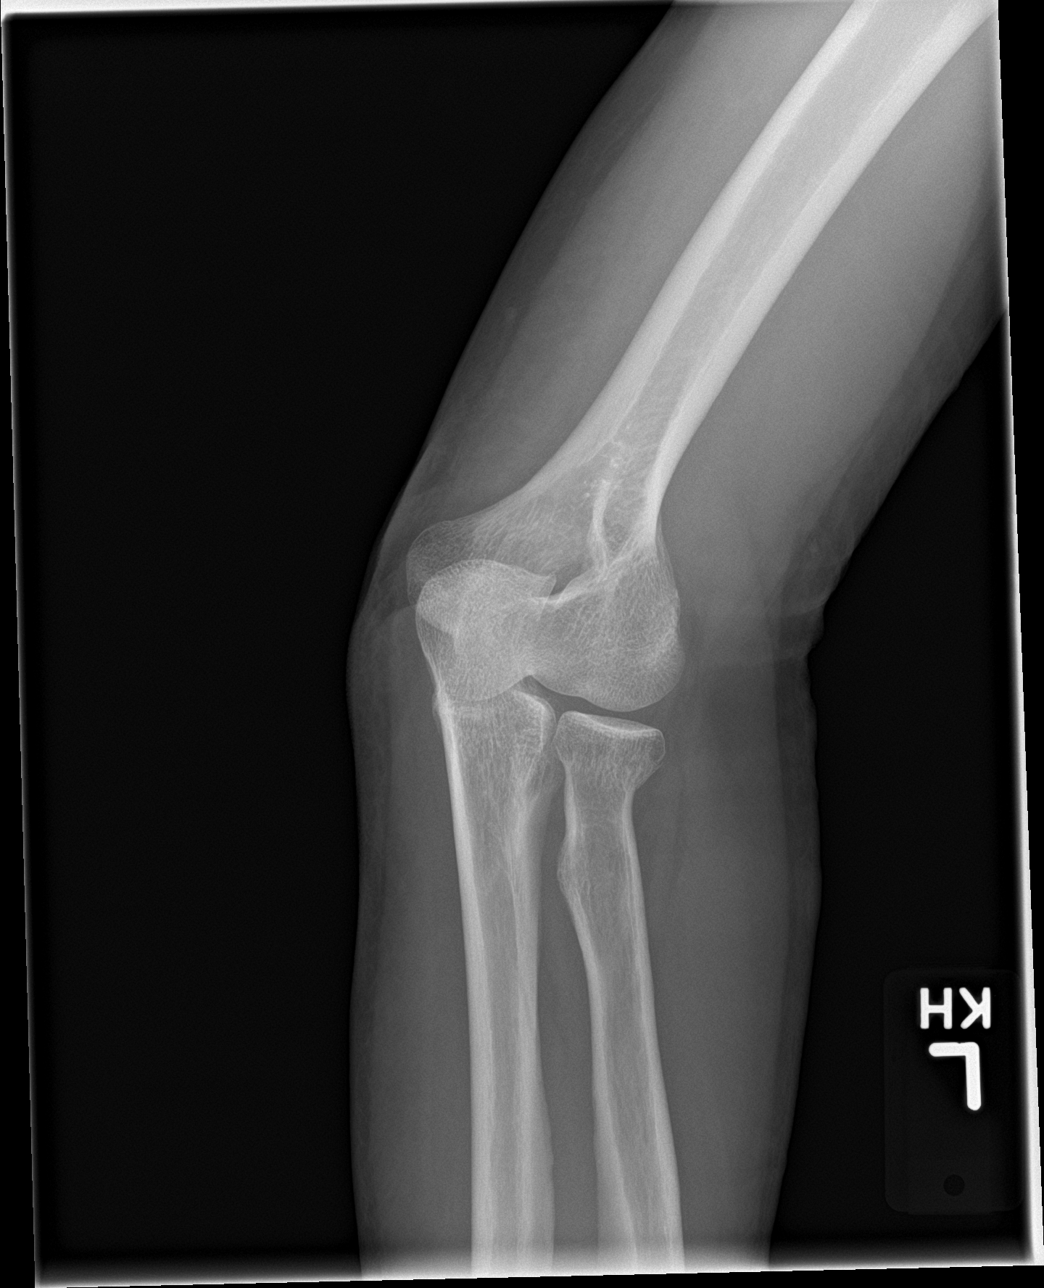

[elbow obl (2 of 2)]
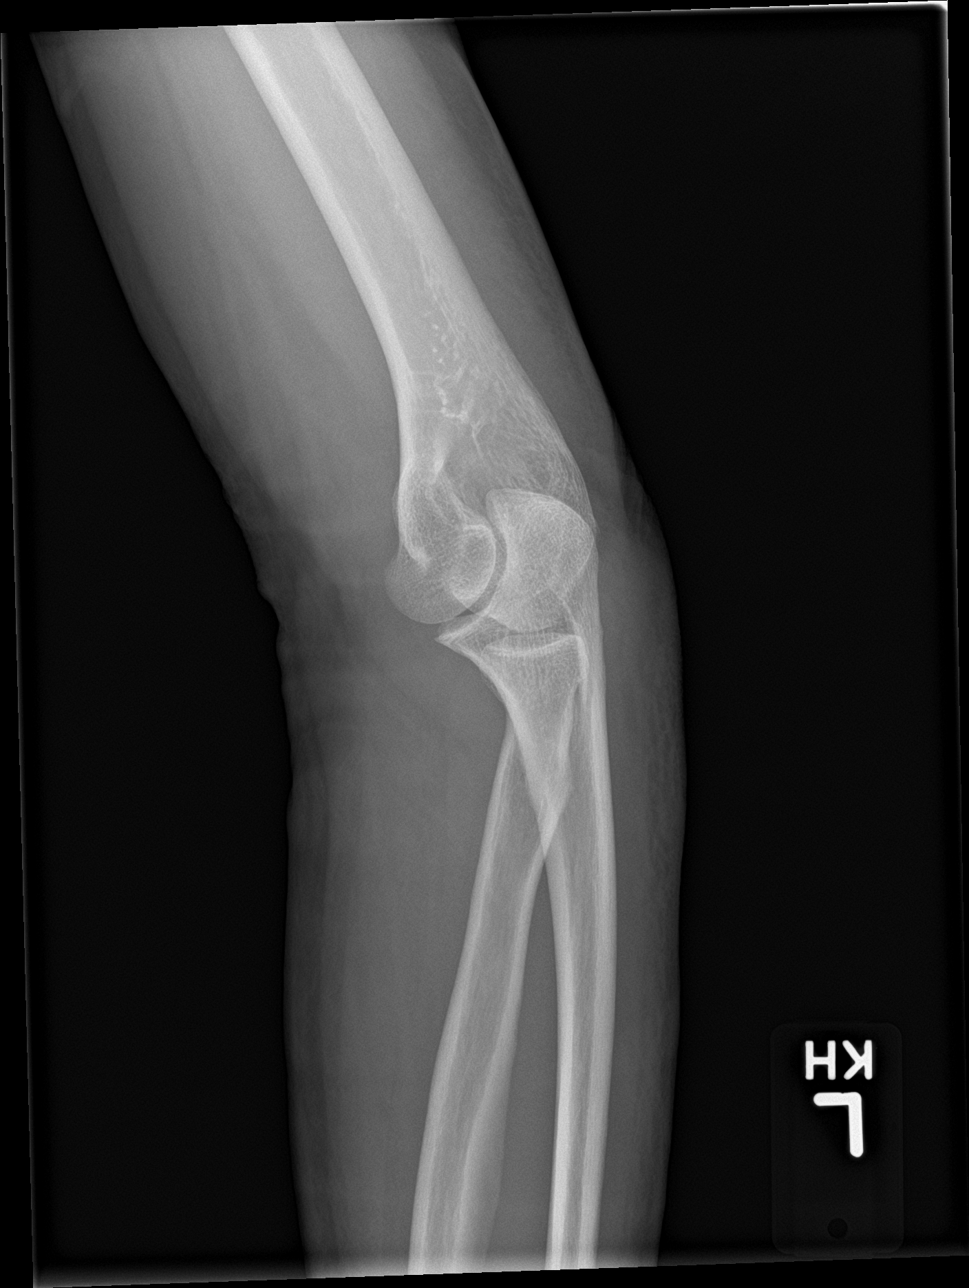

[elbow lat]
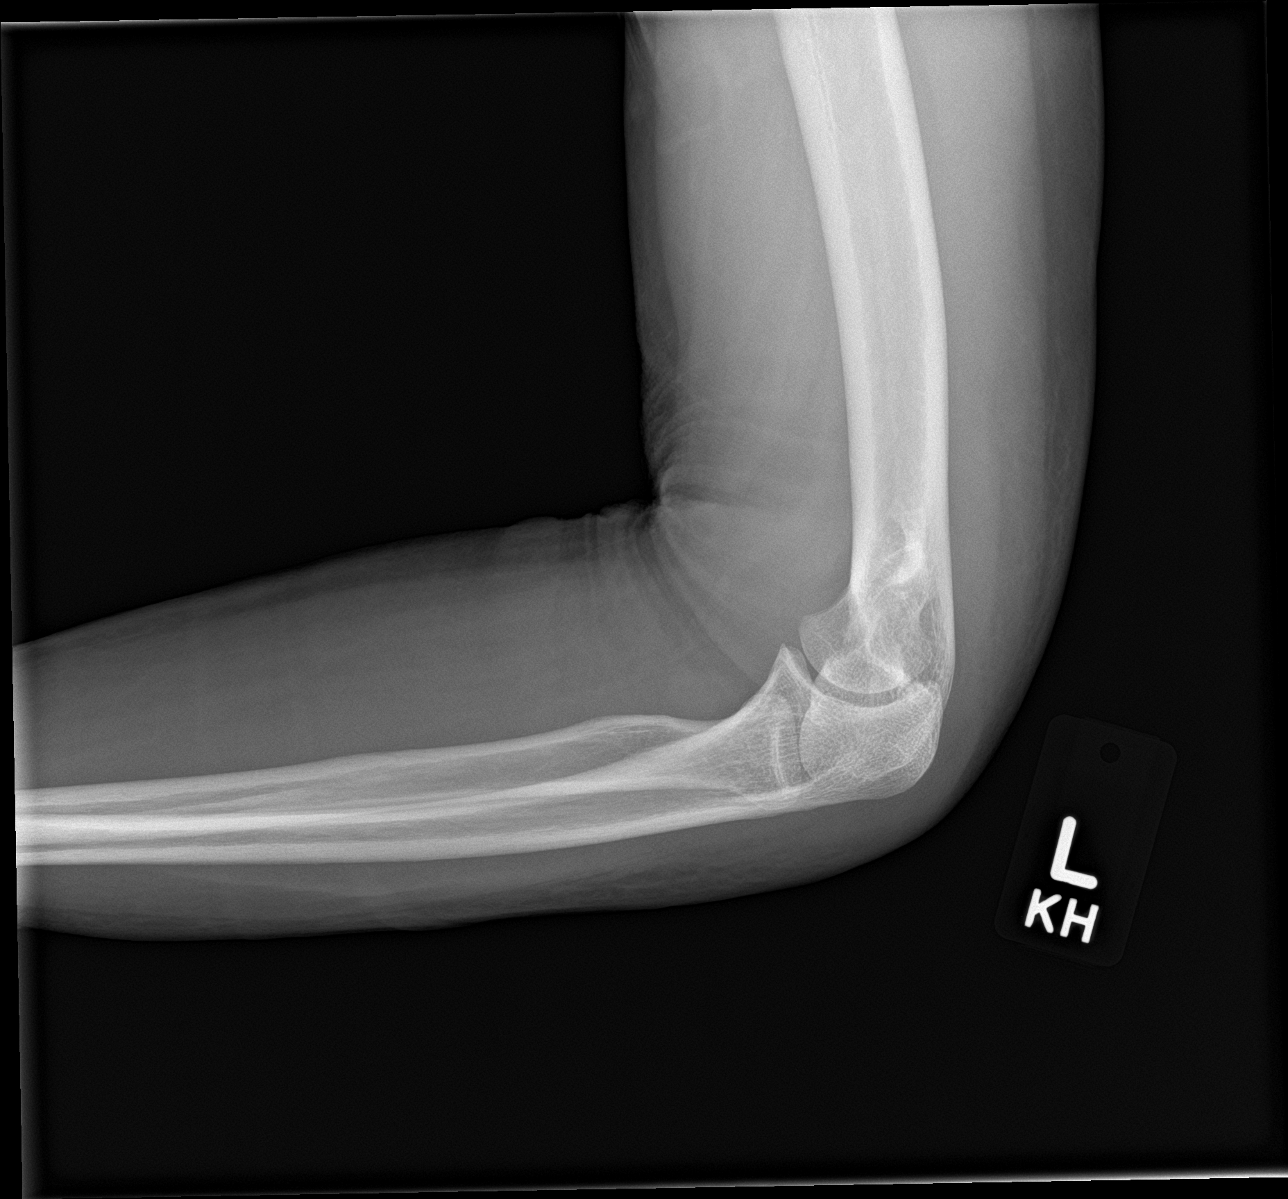

[4 of 4 positions shown; findings below may reference images not displayed]

FINDINGS: Mildly impacted fracture of the neck of the radius. Stable
alignment. Minimal angulation of the radial head. No distraction or
displacement. Resolving elbow effusion.
IMPRESSION: 1. Stable alignment of mildly impacted radial neck fracture.

## 2022-11-01 ENCOUNTER — Other Ambulatory Visit: Payer: Self-pay | Admitting: Gastroenterology

## 2022-11-01 DIAGNOSIS — R109 Unspecified abdominal pain: Secondary | ICD-10-CM

## 2022-11-07 ENCOUNTER — Ambulatory Visit
Admission: RE | Admit: 2022-11-07 | Discharge: 2022-11-07 | Disposition: A | Discharge status: Home / Self Care | Payer: BC Managed Care – PPO | Source: Ambulatory Visit | Attending: Gastroenterology | Admitting: Gastroenterology

## 2022-11-07 DIAGNOSIS — R109 Unspecified abdominal pain: Secondary | ICD-10-CM

## 2022-11-08 ENCOUNTER — Other Ambulatory Visit: Payer: BLUE CROSS/BLUE SHIELD

## 2023-11-20 ENCOUNTER — Other Ambulatory Visit: Payer: Self-pay | Admitting: Gastroenterology

## 2023-11-20 DIAGNOSIS — R109 Unspecified abdominal pain: Secondary | ICD-10-CM

## 2023-12-04 ENCOUNTER — Other Ambulatory Visit

## 2023-12-05 ENCOUNTER — Ambulatory Visit
Admission: RE | Admit: 2023-12-05 | Discharge: 2023-12-05 | Disposition: A | Discharge status: Home / Self Care | Source: Ambulatory Visit | Attending: Gastroenterology | Admitting: Gastroenterology

## 2023-12-05 DIAGNOSIS — R109 Unspecified abdominal pain: Secondary | ICD-10-CM
